# Patient Record
Sex: Male | Born: 1990 | Race: Black or African American | Hispanic: No | Marital: Single | Smoking: Current every day smoker
Health system: Southern US, Community
[De-identification: ages and names within clinical notes are randomized; demographics above are authoritative.]

---

## 2016-01-11 ENCOUNTER — Emergency Department (HOSPITAL_COMMUNITY)
Admission: EM | Admit: 2016-01-11 | Discharge: 2016-01-11 | Disposition: A | Discharge status: Home / Self Care | Payer: Self-pay | Attending: Emergency Medicine | Admitting: Emergency Medicine

## 2016-01-11 ENCOUNTER — Emergency Department (HOSPITAL_COMMUNITY): Payer: Self-pay

## 2016-01-11 ENCOUNTER — Encounter (HOSPITAL_COMMUNITY): Payer: Self-pay | Admitting: *Deleted

## 2016-01-11 DIAGNOSIS — F17210 Nicotine dependence, cigarettes, uncomplicated: Secondary | ICD-10-CM | POA: Insufficient documentation

## 2016-01-11 DIAGNOSIS — X509XXA Other and unspecified overexertion or strenuous movements or postures, initial encounter: Secondary | ICD-10-CM | POA: Insufficient documentation

## 2016-01-11 DIAGNOSIS — Y999 Unspecified external cause status: Secondary | ICD-10-CM | POA: Insufficient documentation

## 2016-01-11 DIAGNOSIS — M25462 Effusion, left knee: Secondary | ICD-10-CM | POA: Insufficient documentation

## 2016-01-11 DIAGNOSIS — Y929 Unspecified place or not applicable: Secondary | ICD-10-CM | POA: Insufficient documentation

## 2016-01-11 DIAGNOSIS — M25562 Pain in left knee: Secondary | ICD-10-CM

## 2016-01-11 DIAGNOSIS — Y939 Activity, unspecified: Secondary | ICD-10-CM | POA: Insufficient documentation

## 2016-01-11 NOTE — ED Notes (Addendum)
Pt reports stepping out of car today and left knee gave out, pt fell down. Reports that his knee "popped out" and pt was able to "pop it back in place" but still having pain. Ambulatory on arrival. Hx of same.

## 2016-01-11 NOTE — Discharge Instructions (Signed)
Follow-up with orthopedics early next week to have your knee reevaluated. Use ice as often as you can, 20 minutes on and 20 minutes off, and take ibuprofen as needed for pain. Be sure to eat with the ibuprofen.  Return to the emergency department if you experience increased swelling or pain, redness or warmth to your knee, numbness/tingling or weakness, fever or chills.  Cryotherapy Cryotherapy means treatment with cold. Ice or gel packs can be used to reduce both pain and swelling. Ice is the most helpful within the first 24 to 48 hours after an injury or flare-up from overusing a muscle or joint. Sprains, strains, spasms, burning pain, shooting pain, and aches can all be eased with ice. Ice can also be used when recovering from surgery. Ice is effective, has very few side effects, and is safe for most people to use. PRECAUTIONS  Ice is not a safe treatment option for people with:  Raynaud phenomenon. This is a condition affecting small blood vessels in the extremities. Exposure to cold may cause your problems to return.  Cold hypersensitivity. There are many forms of cold hypersensitivity, including:  Cold urticaria. Red, itchy hives appear on the skin when the tissues begin to warm after being iced.  Cold erythema. This is a red, itchy rash caused by exposure to cold.  Cold hemoglobinuria. Red blood cells break down when the tissues begin to warm after being iced. The hemoglobin that carry oxygen are passed into the urine because they cannot combine with blood proteins fast enough.  Numbness or altered sensitivity in the area being iced. If you have any of the following conditions, do not use ice until you have discussed cryotherapy with your caregiver:  Heart conditions, such as arrhythmia, angina, or chronic heart disease.  High blood pressure.  Healing wounds or open skin in the area being iced.  Current infections.  Rheumatoid arthritis.  Poor circulation.  Diabetes. Ice  slows the blood flow in the region it is applied. This is beneficial when trying to stop inflamed tissues from spreading irritating chemicals to surrounding tissues. However, if you expose your skin to cold temperatures for too long or without the proper protection, you can damage your skin or nerves. Watch for signs of skin damage due to cold. HOME CARE INSTRUCTIONS Follow these tips to use ice and cold packs safely.  Place a dry or damp towel between the ice and skin. A damp towel will cool the skin more quickly, so you may need to shorten the time that the ice is used.  For a more rapid response, add gentle compression to the ice.  Ice for no more than 10 to 20 minutes at a time. The bonier the area you are icing, the less time it will take to get the benefits of ice.  Check your skin after 5 minutes to make sure there are no signs of a poor response to cold or skin damage.  Rest 20 minutes or more between uses.  Once your skin is numb, you can end your treatment. You can test numbness by very lightly touching your skin. The touch should be so light that you do not see the skin dimple from the pressure of your fingertip. When using ice, most people will feel these normal sensations in this order: cold, burning, aching, and numbness.  Do not use ice on someone who cannot communicate their responses to pain, such as small children or people with dementia. HOW TO MAKE AN ICE PACK Ice  packs are the most common way to use ice therapy. Other methods include ice massage, ice baths, and cryosprays. Muscle creams that cause a cold, tingly feeling do not offer the same benefits that ice offers and should not be used as a substitute unless recommended by your caregiver. To make an ice pack, do one of the following:  Place crushed ice or a bag of frozen vegetables in a sealable plastic bag. Squeeze out the excess air. Place this bag inside another plastic bag. Slide the bag into a pillowcase or place a  damp towel between your skin and the bag.  Mix 3 parts water with 1 part rubbing alcohol. Freeze the mixture in a sealable plastic bag. When you remove the mixture from the freezer, it will be slushy. Squeeze out the excess air. Place this bag inside another plastic bag. Slide the bag into a pillowcase or place a damp towel between your skin and the bag. SEEK MEDICAL CARE IF:  You develop white spots on your skin. This may give the skin a blotchy (mottled) appearance.  Your skin turns blue or pale.  Your skin becomes waxy or hard.  Your swelling gets worse. MAKE SURE YOU:   Understand these instructions.  Will watch your condition.  Will get help right away if you are not doing well or get worse.   This information is not intended to replace advice given to you by your health care provider. Make sure you discuss any questions you have with your health care provider.   Document Released: 02/09/2011 Document Revised: 07/06/2014 Document Reviewed: 02/09/2011 Elsevier Interactive Patient Education 2016 [employer].  Joint Pain Joint pain, which is also called arthralgia, can be caused by many things. Joint pain often goes away when you follow your health care provider's instructions for relieving pain at home. However, joint pain can also be caused by conditions that require further treatment. Common causes of joint pain include:  Bruising in the area of the joint.  Overuse of the joint.  Wear and tear on the joints that occur with aging (osteoarthritis).  Various other forms of arthritis.  A buildup of a crystal form of uric acid in the joint (gout).  Infections of the joint (septic arthritis) or of the bone (osteomyelitis). Your health care provider may recommend medicine to help with the pain. If your joint pain continues, additional tests may be needed to diagnose your condition. HOME CARE INSTRUCTIONS Watch your condition for any changes. Follow these instructions as  directed to lessen the pain that you are feeling.  Take medicines only as directed by your health care provider.  Rest the affected area for as long as your health care provider says that you should. If directed to do so, raise the painful joint above the level of your heart while you are sitting or lying down.  Do not do things that cause or worsen pain.  If directed, apply ice to the painful area:  Put ice in a plastic bag.  Place a towel between your skin and the bag.  Leave the ice on for 20 minutes, 2-3 times per day.  Wear an elastic bandage, splint, or sling as directed by your health care provider. Loosen the elastic bandage or splint if your fingers or toes become numb and tingle, or if they turn cold and blue.  Begin exercising or stretching the affected area as directed by your health care provider. Ask your health care provider what types of exercise are safe  for you.  Keep all follow-up visits as directed by your health care provider. This is important. SEEK MEDICAL CARE IF:  Your pain increases, and medicine does not help.  Your joint pain does not improve within 3 days.  You have increased bruising or swelling.  You have a fever.  You lose 10 lb (4.5 kg) or more without trying. SEEK IMMEDIATE MEDICAL CARE IF:  You are not able to move the joint.  Your fingers or toes become numb or they turn cold and blue.   This information is not intended to replace advice given to you by your health care provider. Make sure you discuss any questions you have with your health care provider.   Document Released: 06/15/2005 Document Revised: 07/06/2014 Document Reviewed: 03/27/2014 Elsevier Interactive Patient Education [employer]2016 Elsevier Inc.

## 2016-01-11 NOTE — ED Notes (Signed)
Returned from xray

## 2016-01-11 NOTE — ED Provider Notes (Signed)
CSN: [id number]     Arrival date & time 01/11/16  1222 History  By signing my name below, I, Gregory Hutchinson, attest that this documentation has been prepared under the direction and in the presence of non-physician practitioner, Mattie MarlinJessica Adrean Heitz, PA-C. Electronically Signed: Freida Busmaniana Hutchinson, Scribe. 01/11/2016. 3:02 PM.  Chief Complaint  Patient presents with  . Knee Pain   The history is provided by the patient. No language interpreter was used.    HPI Comments:  Gregory Hutchinson is a 25 y.o. male who presents to the Emergency Department complaining of 8/10, left knee pain s/p fall today. He reports associated mild swelling to the site.  He states he fell while getting out of his car and the knee "popped" out of place. He states he popped it back into place PTA but the knee feels unstable like it is going to pop out again. He has been able to bear weight on the extremity.  He reports h/o "popping" the same knee out of place ~ 3 years ago and was able to resolve the issue on his own. He denies numbness and tingling in his extremities. Pt has no other complaints or symptoms at this time.   History reviewed. No pertinent past medical history. History reviewed. No pertinent past surgical history. History reviewed. No pertinent family history. Social History  Substance Use Topics  . Smoking status: Current Every Day Smoker    Types: Cigarettes  . Smokeless tobacco: None  . Alcohol Use: No    Review of Systems  Constitutional: Negative for fever and chills.  Gastrointestinal: Negative for nausea and vomiting.  Musculoskeletal: Positive for joint swelling and arthralgias (knee pain).  Neurological: Negative for dizziness, syncope, weakness and numbness.    Allergies  Review of patient's allergies indicates no known allergies.  Home Medications   Prior to Admission medications   Not on File   BP 123/74 mmHg  Pulse 90  Temp(Src) 99.2 F (37.3 C) (Oral)  Resp 18  SpO2 97% Physical Exam   Constitutional: He appears well-developed and well-nourished. No distress.  HENT:  Head: Normocephalic and atraumatic.  Eyes: Conjunctivae are normal.  Cardiovascular:  Pulses:      Dorsalis pedis pulses are 2+ on the right side, and 2+ on the left side.  Pulmonary/Chest: Effort normal. No respiratory distress.  Musculoskeletal: Normal range of motion.  Examination of left knee revealed mild effusion, no deformities, no erythema, no signs of infection, limited AROM due to pain, strength 5/5 of bilateral lower extremities, medial and lateral ligaments stable, anterior cruciate ligament and PCL also stable, Patellar tracking appropriately without crepitus, patient neurovascular intact distally.   Neurological: He is alert. Coordination normal.  Skin: Skin is warm and dry. He is not diaphoretic.  Psychiatric: He has a normal mood and affect. His behavior is normal.  Nursing note and vitals reviewed.   ED Course  Procedures   DIAGNOSTIC STUDIES:  Oxygen Saturation is 97% on RA, normal by my interpretation.    COORDINATION OF CARE:  2:57 PM Pt updated with XR results.  Discussed treatment plan with pt at bedside and pt agreed to plan.   Imaging Review Dg Knee Complete 4 Views Left  01/11/2016  CLINICAL DATA:  Twisting left knee getting out of truck. EXAM: LEFT KNEE - COMPLETE 4+ VIEW COMPARISON:  None. FINDINGS: No evidence of fracture, dislocation, or joint effusion. No evidence of arthropathy or other focal bone abnormality. Soft tissues are unremarkable. IMPRESSION: Negative. Electronically Signed   By:  Kennith Center M.D.   On: 01/11/2016 14:06   I have personally reviewed and evaluated these images and lab results as part of my medical decision-making.    MDM   Final diagnoses:  Left knee pain  Knee swelling, left    Patient X-Ray negative for obvious fracture or dislocation.  Pt advised to follow up with orthopedics. Pt given brace in the ED,  conservative therapy  recommended and discussed. Patient will be discharged home & is agreeable with above plan. Patient able to walk while in the ED, Returns precautions discussed. Pt appears safe for discharge.  I personally performed the services described in this documentation, which was scribed in my presence. The recorded information has been reviewed and is accurate.      Jerre Simon, PA 01/11/16 1707  Glynn Octave, MD 01/11/16 [phone]

## 2017-03-25 IMAGING — CR DG KNEE COMPLETE 4+V*L*
4 series · 4 of 4 positions shown · non-contrast
Comparison: None.

CLINICAL DATA: Twisting left knee getting out of truck.

EXAM:
LEFT KNEE - COMPLETE 4+ VIEW

[knee ap]
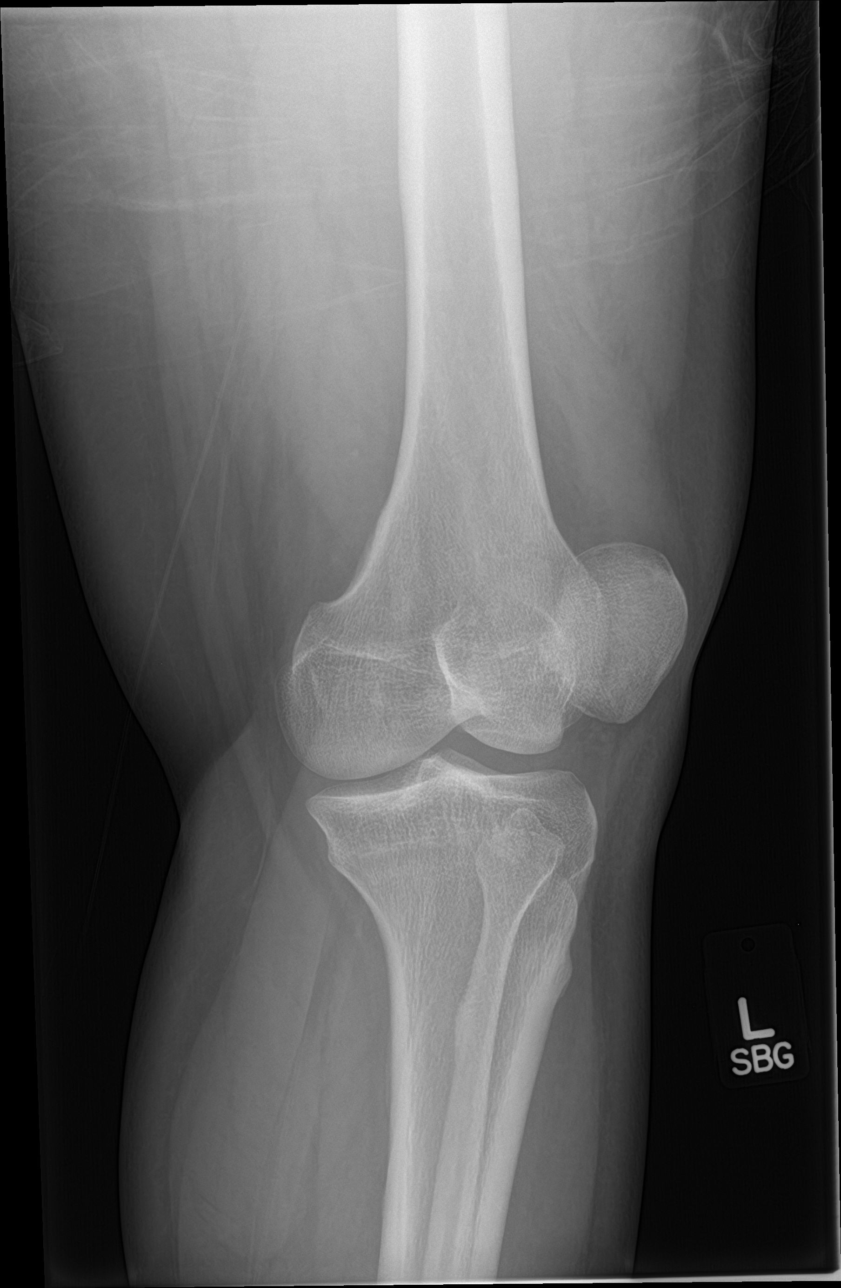

[knee lat]
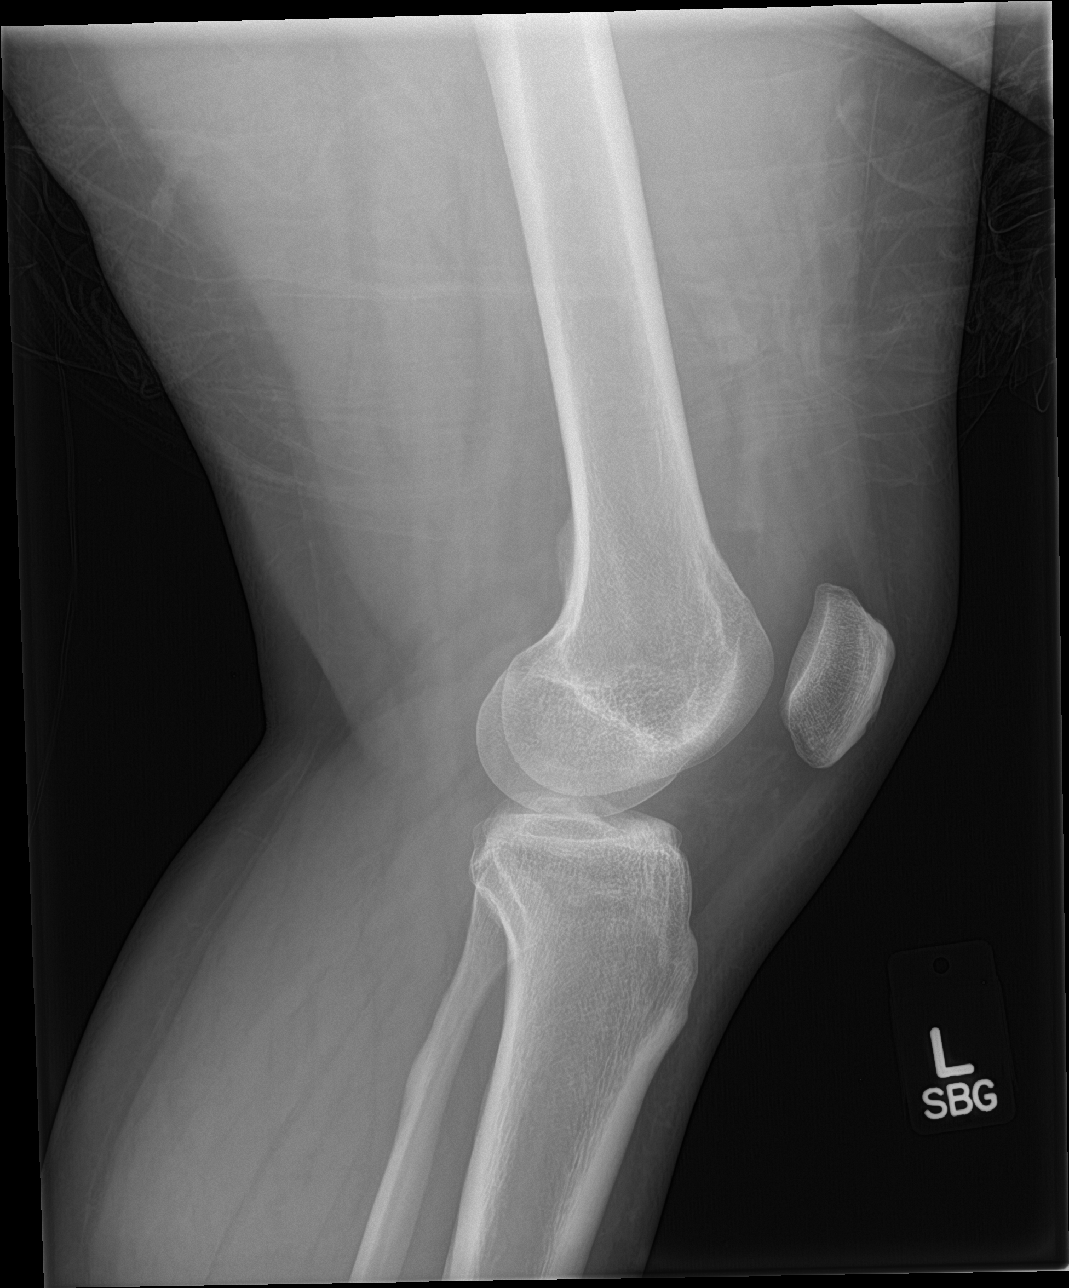

[knee obl (1 of 2)]
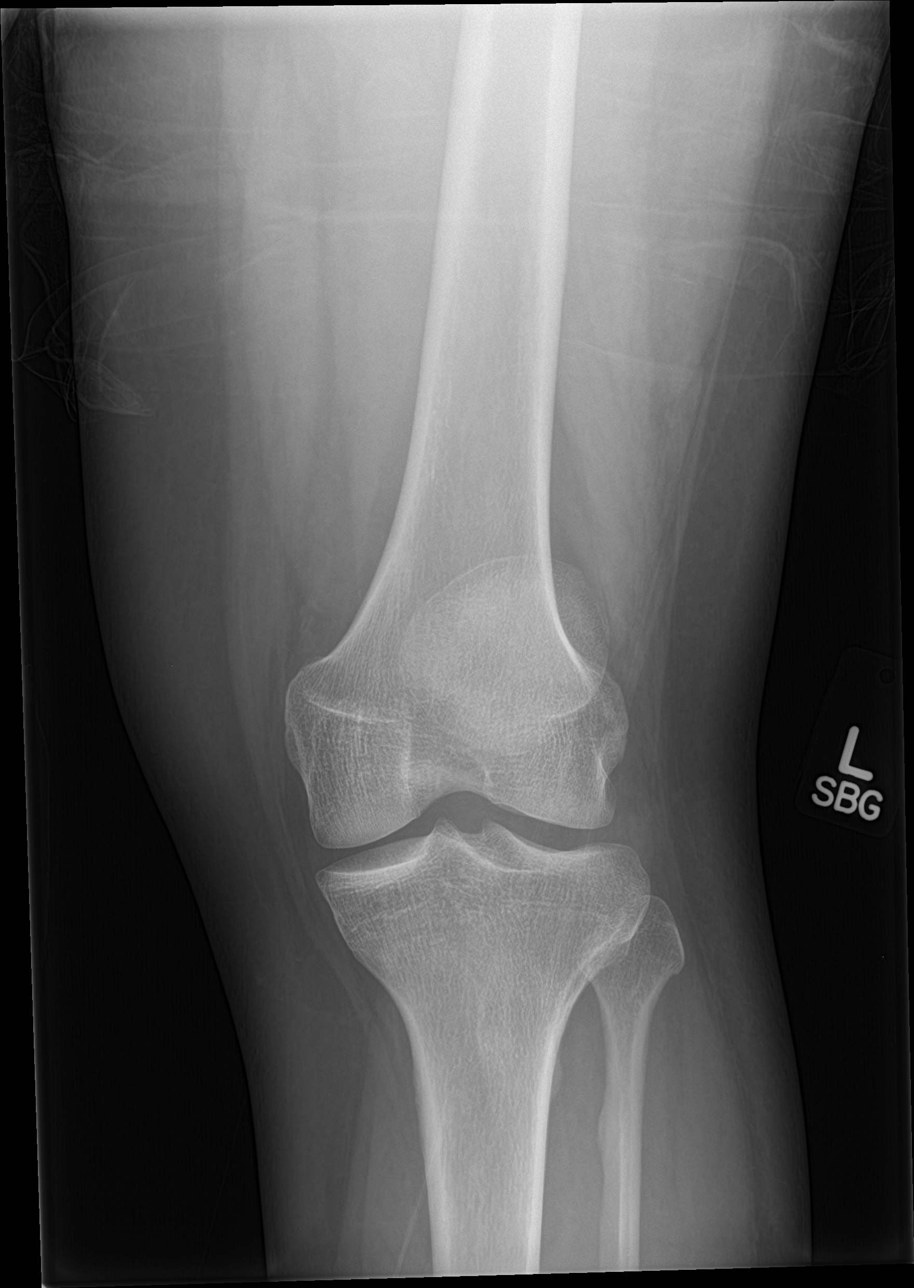

[knee obl (2 of 2)]
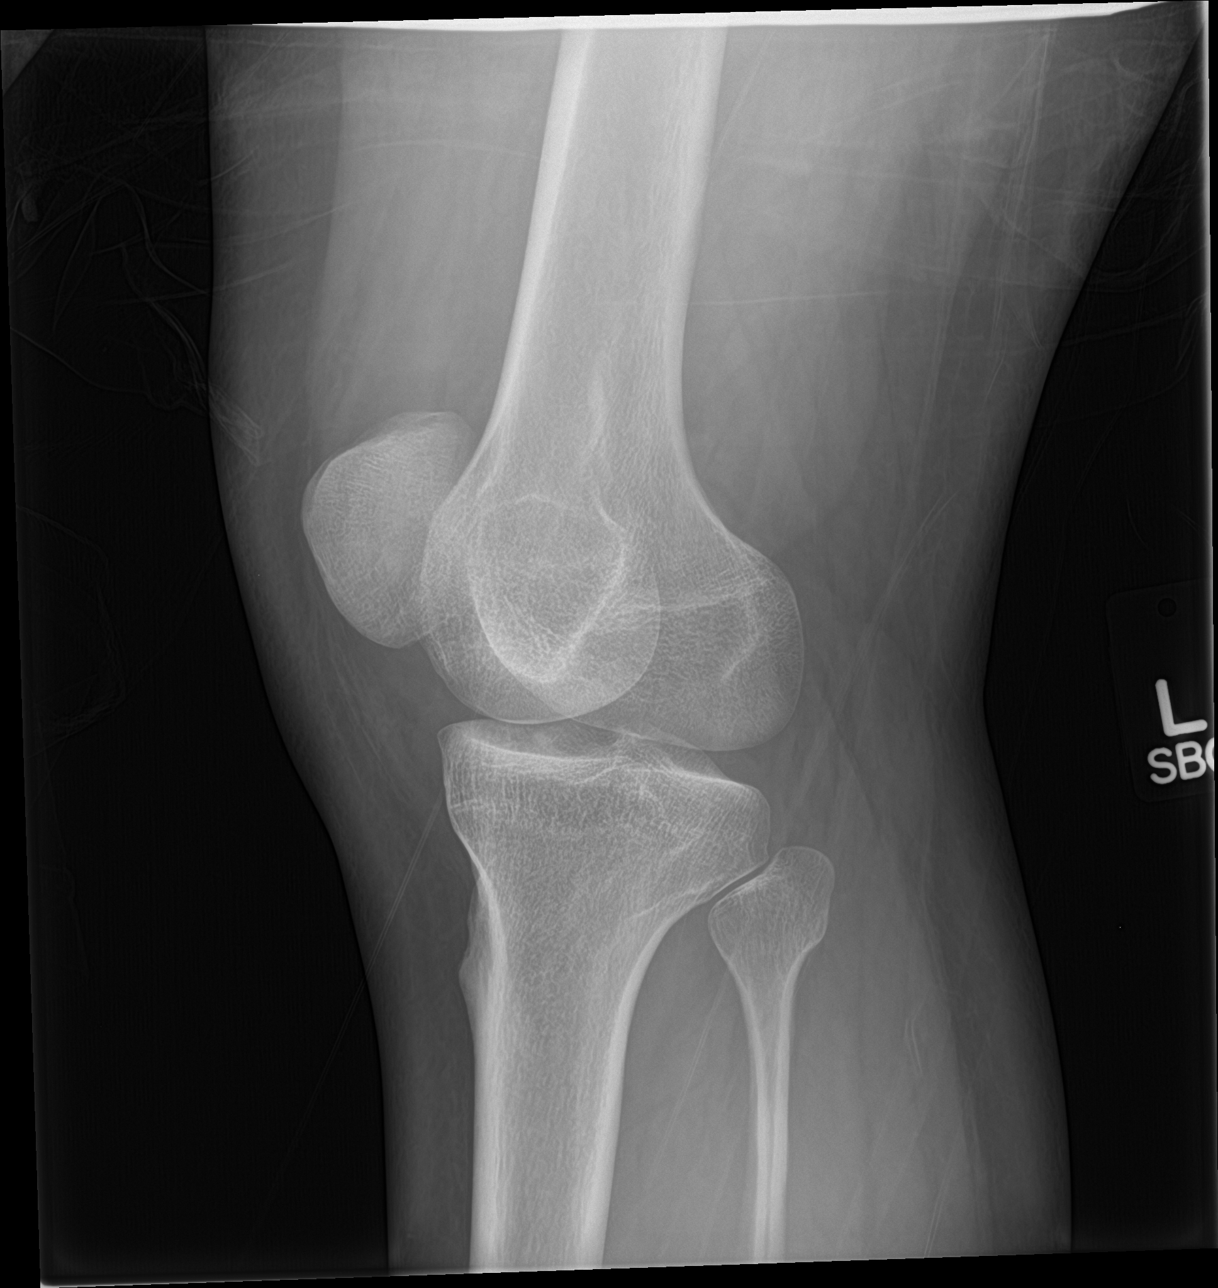

[4 of 4 positions shown; findings below may reference images not displayed]

FINDINGS: No evidence of fracture, dislocation, or joint effusion. No evidence
of arthropathy or other focal bone abnormality. Soft tissues are
unremarkable.
IMPRESSION: Negative.

## 2018-06-09 ENCOUNTER — Ambulatory Visit (HOSPITAL_COMMUNITY)
Admission: EM | Admit: 2018-06-09 | Discharge: 2018-06-09 | Disposition: A | Discharge status: Home / Self Care | Payer: Self-pay | Attending: Emergency Medicine | Admitting: Emergency Medicine

## 2018-06-09 ENCOUNTER — Encounter (HOSPITAL_COMMUNITY): Payer: Self-pay | Admitting: Emergency Medicine

## 2018-06-09 ENCOUNTER — Other Ambulatory Visit: Payer: Self-pay

## 2018-06-09 DIAGNOSIS — J069 Acute upper respiratory infection, unspecified: Secondary | ICD-10-CM | POA: Insufficient documentation

## 2018-06-09 MED ORDER — BENZONATATE 200 MG PO CAPS: 200.0000 mg | ORAL_CAPSULE | Freq: Three times a day (TID) | ORAL | 0 refills | Status: AC | PRN

## 2018-06-09 MED ORDER — IBUPROFEN 600 MG PO TABS: 600.0000 mg | ORAL_TABLET | Freq: Four times a day (QID) | ORAL | 0 refills | Status: AC | PRN

## 2018-06-09 MED ORDER — FLUTICASONE PROPIONATE 50 MCG/ACT NA SUSP: 2.0000 | Freq: Every day | NASAL | 0 refills | Status: AC

## 2018-06-09 NOTE — ED Triage Notes (Signed)
PT reports congestion and cough that started 2 days ago. Temp 100.0 in triage.   PT reports he "stopped breathing" in his sleep last night. He woke up coughing. He called EMS and then denied transport to hospital. They told him to get checked out today.

## 2018-06-09 NOTE — ED Notes (Signed)
Patient able to ambulate independently  

## 2018-06-09 NOTE — Discharge Instructions (Addendum)
Take the medication as written. Take 1 gram of tylenol with the 600 mg motrin up to 3-4 times a day as needed for pain and fever. This is an effective combination. Drink extra fluids. Start taking  the mucinex- D to keep the mucus secretions thin  and to decongest you. Use the NeilMed sinus rinse as often as you want to to reduce nasal congestion. Follow the directions on the box.  Go to www.goodrx.com to look up your medications. This will give you a list of where you can find your prescriptions at the most affordable prices. Or ask the pharmacist what the cash price is, or if they have any other discount programs available to help make your medication more affordable. This can be less expensive than what you would pay with insurance.    Below is a list of primary care practices who are taking new patients for you to follow-up with. [facility] [address] [place] [address] [phone]  [facility] [phone] [address] [place] [id number]  [facility]: [address] [place] [id number]  [phone]  [facility]: [address] [place] [id number]   [phone]  [facility]: [address] [place] [address]  [phone]  [facility] : [address] [place] [id number] [phone]  [facility]: [address] [place] [phone]  [facility]: [address] [place] [id number] [phone]  Dr. Oneal GroutMahima Pandey [address] [facility] [address] [facility] [place] [id number]  [phone]  Dr. Jackie PlumGeorge Osei-Bonsu, [facility]. [address] [place], [place] [address]  [phone]  Go to www.goodrx.com to look up your  medications. This will give you a list of where you can find your prescriptions at the most affordable prices. Or ask the pharmacist what the cash price is, or if they have any other discount programs available to help make your medication more affordable. This can be less expensive than what you would pay with insurance.

## 2018-06-09 NOTE — ED Provider Notes (Signed)
HPI  SUBJECTIVE:  Gregory Hutchinson is a 27 y.o. male who presents with waking up last night short of breath.  States that it lasted about 5 minutes and then resolved on its own.  He denies chest pain, palpitations, nausea, diaphoresis, wheezing, or GERD symptoms.  He called EMS who evaluated him, thought that he was okay, and was told to get evaluated today.  He has been having symptoms like this for 4 years, states that he will have an episode every 5 or 6 months.  He states usually it lasts a few seconds and then he is able to catch his breath.  He denies calf pain, swelling, hemoptysis, prolonged immobilization, recent surgery.  He also states that he has had nasal congestion, rhinorrhea, postnasal drip, cough productive of white mucus starting several days ago.  States it started off as a sore throat.  He reports feeling feverish but has had no documented temperature.  He reports a headache with a cough, but no body aches, photophobia, neck stiffness, ear pain, sinus pain or pressure, abdominal pain.  He did not get a flu shot this year.  He has had several contacts with the flu.  No antipyretic in the past 4 to 6 hours.  He tried Robitussin with improvement in his symptoms.  No aggravating factors.  He has a past medical history of GERD, and he snores.  No diagnosis of OSA, DVT, PE, diabetes, hypertension.  He is a smoker.  PMD: None.    History reviewed. No pertinent past medical history.  History reviewed. No pertinent surgical history.  No family history on file.  Social History   Tobacco Use  . Smoking status: Current Every Day Smoker    Types: Cigarettes  Substance Use Topics  . Alcohol use: No  . Drug use: No    No current facility-administered medications for this encounter.   Current Outpatient Medications:  .  benzonatate (TESSALON) 200 MG capsule, Take 1 capsule (200 mg total) by mouth 3 (three) times daily as needed for cough., Disp: 30 capsule, Rfl: 0 .  fluticasone  (FLONASE) 50 MCG/ACT nasal spray, Place 2 sprays into both nostrils daily., Disp: 16 g, Rfl: 0 .  ibuprofen (ADVIL,MOTRIN) 600 MG tablet, Take 1 tablet (600 mg total) by mouth every 6 (six) hours as needed., Disp: 30 tablet, Rfl: 0  No Known Allergies   ROS  As noted in HPI.   Physical Exam  BP 113/77   Pulse 85   Temp 100 F (37.8 C) (Temporal)   Resp 16   SpO2 99%   Constitutional: Well developed, well nourished, no acute distress Eyes:  EOMI, conjunctiva normal bilaterally HENT: Normocephalic, atraumatic,mucus membranes moist.  Clear rhinorrhea.  Erythematous, swollen turbinates.  No maxillary or frontal sinus tenderness.  Normal oropharynx.  No obvious postnasal drip or cobblestoning. Neck: No cervical lymphadenopathy, meningismus Respiratory: Normal inspiratory effort, lungs clear bilaterally, good air movement Cardiovascular: Normal rate regular rhythm, no murmurs, rubs, gallops GI: nondistended soft. Back: No CVAT skin: No rash, skin intact Musculoskeletal: Calves symmetric, nontender, no edema. Neurologic: Alert & oriented x 3, no focal neuro deficits Psychiatric: Speech and behavior appropriate   ED Course   Medications - No data to display  No orders of the defined types were placed in this encounter.   No results found for this or any previous visit (from the past 24 hour(s)). No results found.  ED Clinical Impression  Upper respiratory tract infection, unspecified type   ED Assessment/Plan  Patient with a URI.  He has a mild fever here, but not enough to defintively call it influenza.  His vitals are otherwise acceptable.  Home with Flonase, saline nasal irrigation, ibuprofen/Tylenol, Mucinex D, Tessalon.  Will provide a primary care referral list for ongoing care.  As for the episode of shortness of breath last night.  This is been going on for about 4 years.  He is PERC negative.  Doubt PE, pneumonia.  Suspect OSA.  Advised him that he will need to  follow-up with a primary care physician and get a sleep study.  Discussed medical decision making, treatment plan and plan for follow-up with patient.  He agrees with plan.  Meds ordered this encounter  Medications  . fluticasone (FLONASE) 50 MCG/ACT nasal spray    Sig: Place 2 sprays into both nostrils daily.    Dispense:  16 g    Refill:  0  . benzonatate (TESSALON) 200 MG capsule    Sig: Take 1 capsule (200 mg total) by mouth 3 (three) times daily as needed for cough.    Dispense:  30 capsule    Refill:  0  . ibuprofen (ADVIL,MOTRIN) 600 MG tablet    Sig: Take 1 tablet (600 mg total) by mouth every 6 (six) hours as needed.    Dispense:  30 tablet    Refill:  0    *This clinic note was created using Scientist, clinical (histocompatibility and immunogenetics). Therefore, there may be occasional mistakes despite careful proofreading.   ?   Domenick Gong, MD 06/10/18 [phone]
